# Patient Record
Sex: Male | Born: 1950 | Race: White | Hispanic: No | Marital: Married | Smoking: Never smoker
Health system: Southern US, Community
[De-identification: ages and names within clinical notes are randomized; demographics above are authoritative.]

---

## 2014-03-22 ENCOUNTER — Encounter (HOSPITAL_COMMUNITY): Payer: Self-pay | Admitting: Emergency Medicine

## 2014-03-22 ENCOUNTER — Emergency Department (HOSPITAL_COMMUNITY): Payer: No Typology Code available for payment source

## 2014-03-22 ENCOUNTER — Emergency Department (HOSPITAL_COMMUNITY)
Admission: EM | Admit: 2014-03-22 | Discharge: 2014-03-22 | Disposition: A | Discharge status: Home / Self Care | Payer: No Typology Code available for payment source | Attending: Emergency Medicine | Admitting: Emergency Medicine

## 2014-03-22 DIAGNOSIS — R109 Unspecified abdominal pain: Secondary | ICD-10-CM | POA: Insufficient documentation

## 2014-03-22 DIAGNOSIS — N200 Calculus of kidney: Secondary | ICD-10-CM | POA: Insufficient documentation

## 2014-03-22 DIAGNOSIS — Z7982 Long term (current) use of aspirin: Secondary | ICD-10-CM | POA: Insufficient documentation

## 2014-03-22 DIAGNOSIS — Z79899 Other long term (current) drug therapy: Secondary | ICD-10-CM | POA: Diagnosis not present

## 2014-03-22 LAB — URINALYSIS, ROUTINE W REFLEX MICROSCOPIC
BILIRUBIN URINE: NEGATIVE
Glucose, UA: NEGATIVE mg/dL
KETONES UR: 15 mg/dL — AB
Leukocytes, UA: NEGATIVE
Nitrite: NEGATIVE
Protein, ur: NEGATIVE mg/dL
Specific Gravity, Urine: 1.02 (ref 1.005–1.030)
Urobilinogen, UA: 0.2 mg/dL (ref 0.0–1.0)
pH: 5.5 (ref 5.0–8.0)

## 2014-03-22 LAB — URINE MICROSCOPIC-ADD ON

## 2014-03-22 MED ORDER — ONDANSETRON HCL 4 MG/2ML IJ SOLN
4.0000 mg | Freq: Once | INTRAMUSCULAR | Status: AC
  Administered 2014-03-22: 4 mg via INTRAVENOUS

## 2014-03-22 MED ORDER — KETOROLAC TROMETHAMINE 30 MG/ML IJ SOLN
15.0000 mg | Freq: Once | INTRAMUSCULAR | Status: AC
  Administered 2014-03-22: 15 mg via INTRAVENOUS
  Filled 2014-03-22: qty 1

## 2014-03-22 MED ORDER — TAMSULOSIN HCL 0.4 MG PO CAPS: 0.4000 mg | ORAL_CAPSULE | Freq: Every day | ORAL | Status: AC

## 2014-03-22 MED ORDER — PROMETHAZINE HCL 25 MG PO TABS: 25.0000 mg | ORAL_TABLET | Freq: Four times a day (QID) | ORAL | Status: AC | PRN

## 2014-03-22 MED ORDER — MORPHINE SULFATE 2 MG/ML IJ SOLN
INTRAMUSCULAR | Status: AC
  Filled 2014-03-22: qty 1

## 2014-03-22 MED ORDER — KETOROLAC TROMETHAMINE 30 MG/ML IJ SOLN
30.0000 mg | Freq: Once | INTRAMUSCULAR | Status: AC
  Administered 2014-03-22: 30 mg via INTRAVENOUS
  Filled 2014-03-22: qty 1

## 2014-03-22 MED ORDER — OXYCODONE-ACETAMINOPHEN 5-325 MG PO TABS: 2.0000 | ORAL_TABLET | ORAL | Status: AC | PRN

## 2014-03-22 MED ORDER — ONDANSETRON HCL 4 MG/2ML IJ SOLN
4.0000 mg | Freq: Once | INTRAMUSCULAR | Status: DC
  Filled 2014-03-22: qty 2

## 2014-03-22 MED ORDER — MORPHINE SULFATE 4 MG/ML IJ SOLN
4.0000 mg | Freq: Once | INTRAMUSCULAR | Status: AC
  Administered 2014-03-22: 4 mg via INTRAVENOUS
  Filled 2014-03-22: qty 1

## 2014-03-22 MED ORDER — ONDANSETRON HCL 4 MG/2ML IJ SOLN: 4.0000 mg | Freq: Once | INTRAMUSCULAR | Status: DC

## 2014-03-22 MED ORDER — MORPHINE SULFATE 2 MG/ML IJ SOLN
2.0000 mg | Freq: Once | INTRAMUSCULAR | Status: AC
  Administered 2014-03-22: 2 mg via INTRAVENOUS

## 2014-03-22 NOTE — Discharge Instructions (Signed)
CT scan shows a 3 mm kidney stone in your left ureter. Medication for pain, nausea, and to increase urinary flow. Followup with urologist if not improving. Phone number given.

## 2014-03-22 NOTE — ED Notes (Signed)
Patient with no complaints at this time. Respirations even and unlabored. Skin warm/dry. Discharge instructions reviewed with patient at this time. Patient given opportunity to voice concerns/ask questions. IV removed per policy and band-aid applied to site. Patient discharged at this time and left Emergency Department with steady gait.  

## 2014-03-22 NOTE — ED Notes (Signed)
Pt states left flank pain which began at 0400. Had same pain for a while yesterday also. Vomited this morning. No blood in urine at this point, per pt. Pt states he has been drinking lots of water since yesterday. Also states he has been moving for a couple of weeks but this does not feel like muscle pain, per pt.

## 2014-03-22 NOTE — ED Notes (Signed)
Phone xray regarding lab. Lab results are showing on their side but not in the ED chart. Results printed and brought over to EDP.

## 2014-03-22 NOTE — ED Provider Notes (Signed)
CSN: [id number]     Arrival date & time 03/22/14  0736 History   First MD Initiated Contact with Patient 03/22/14 0750    This chart was scribed for Alex Hutching, MD by Freida Busman, ED Scribe. This patient was seen in room [facility] and the patient's care was started 8:04 AM.  Chief Complaint  Patient presents with  . Flank Pain     The history is provided by the patient.   HPI Comments:  Alex Schneider is a 63 y.o. male who presents to the Emergency Department complaining of intermittent left flank pain that started about 2 days ago and  has been constant for about 4 hrs today. Pt reports associated nausea and vomiting and denies hematuria. Pt also reports h/o right sided kidney stone about 4-6 months ago, notes he was not evaluated for his previous kidney stone. No alleviating factors noted.     History reviewed. No pertinent past medical history. History reviewed. No pertinent past surgical history. No family history on file. History  Substance Use Topics  . Smoking status: Never Smoker   . Smokeless tobacco: Not on file  . Alcohol Use: Yes     Comment: Occ wine    Review of Systems  All other systems reviewed and are negative.   10 systems reviewed and negative other than pertinent ROS in HPI    Allergies  Review of patient's allergies indicates no known allergies.  Home Medications   Prior to Admission medications   Medication Sig Start Date End Date Taking? Authorizing Provider  Ascorbic Acid (VITAMIN C PO) Take 1 tablet by mouth daily.   Yes Historical Provider, MD  aspirin 325 MG tablet Take 650 mg by mouth daily as needed for moderate pain.   Yes Historical Provider, MD  Ibuprofen-Diphenhydramine Cit (MOTRIN PM PO) Take 1 tablet by mouth at bedtime as needed (pain).   Yes Historical Provider, MD  oxyCODONE-acetaminophen (PERCOCET) 5-325 MG per tablet Take 2 tablets by mouth every 4 (four) hours as needed. 03/22/14   Alex Hutching, MD  promethazine (PHENERGAN) 25  MG tablet Take 1 tablet (25 mg total) by mouth every 6 (six) hours as needed. 03/22/14   Alex Hutching, MD  tamsulosin (FLOMAX) 0.4 MG CAPS capsule Take 1 capsule (0.4 mg total) by mouth daily. 03/22/14   Alex Hutching, MD   BP 138/72  Pulse 64  Temp(Src) 97.7 F (36.5 C) (Oral)  Resp 18  Ht 6' (1.829 m)  Wt 195 lb (88.451 kg)  BMI 26.44 kg/m2  SpO2 97% Physical Exam  Nursing note and vitals reviewed. Constitutional: He is oriented to person, place, and time. He appears well-developed and well-nourished.  HENT:  Head: Normocephalic and atraumatic.  Eyes: Conjunctivae and EOM are normal. Pupils are equal, round, and reactive to light.  Neck: Normal range of motion. Neck supple.  Cardiovascular: Normal rate, regular rhythm and normal heart sounds.   Pulmonary/Chest: Effort normal and breath sounds normal.  Abdominal: Soft. Bowel sounds are normal.  Genitourinary:  Left flank tenderness  Musculoskeletal: Normal range of motion.  Neurological: He is alert and oriented to person, place, and time.  Skin: Skin is warm and dry.  Psychiatric: He has a normal mood and affect. His behavior is normal.    ED Course  Procedures (including critical care time)   DIAGNOSTIC STUDIES:  Oxygen Saturation is 99% on room air, normal by my interpretation.    COORDINATION OF CARE:  8:07 AM Discussed possibility of kidney stone and  plan to order CT and control pain with pt at bedside and pt agreed to plan.   Labs Review Labs Reviewed  URINALYSIS, ROUTINE W REFLEX MICROSCOPIC - Abnormal; Notable for the following:    Hgb urine dipstick LARGE (*)    Ketones, ur 15 (*)    All other components within normal limits  URINE MICROSCOPIC-ADD ON    Imaging Review No results found.   EKG Interpretation None      MDM   Final diagnoses:  Kidney stone on left side   CT scan reveals a left mid ureteral 3 mm stone. Pain is well-controlled. Discharge medications Percocet, Phenergan 25 mg, Flomax 0.4  mg.     Referral to urology   I personally performed the services described in this documentation, which was scribed in my presence. The recorded information has been reviewed and is accurate.      Alex Hutching, MD 03/22/14 1159

## 2015-04-18 IMAGING — CT CT ABD-PELV W/O CM
2 of 4 series · 16 of 46 positions shown, 18 images · non-contrast
Comparison: None.

CLINICAL DATA: Left flank pain.  Rule out kidney stone.

EXAM:
CT ABDOMEN AND PELVIS WITHOUT CONTRAST
TECHNIQUE: Multidetector CT imaging of the abdomen and pelvis was performed
following the standard protocol without IV contrast.

[Series 2: standard/full over (age)lbs 5.0 · axial · 0.85mm/px · z∈[-495,-25]mm · 13 of 104 slices shown, 15 images]
[im 5/104  soft-tissue]
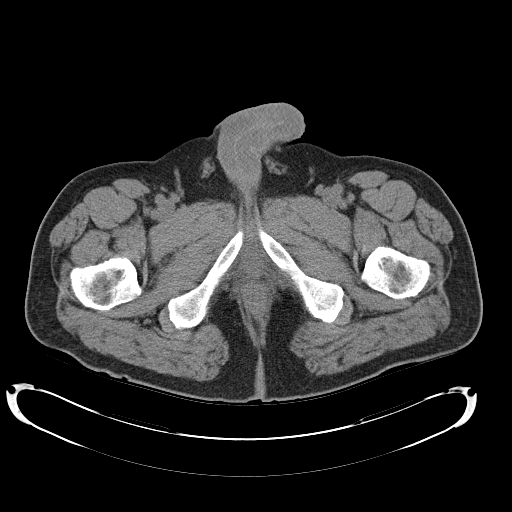
[im 5/104  bone]
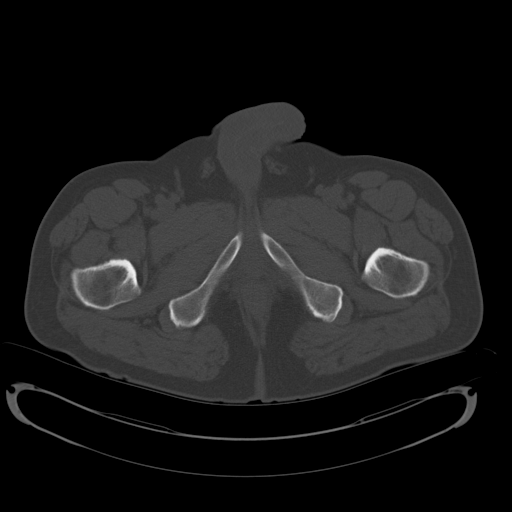
[im 13/104  soft-tissue]
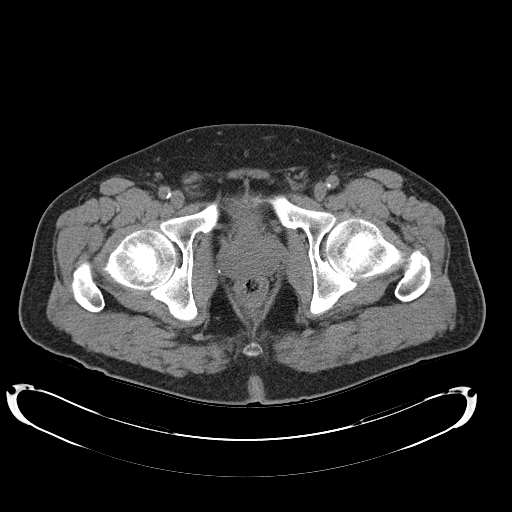
[im 22/104  soft-tissue]
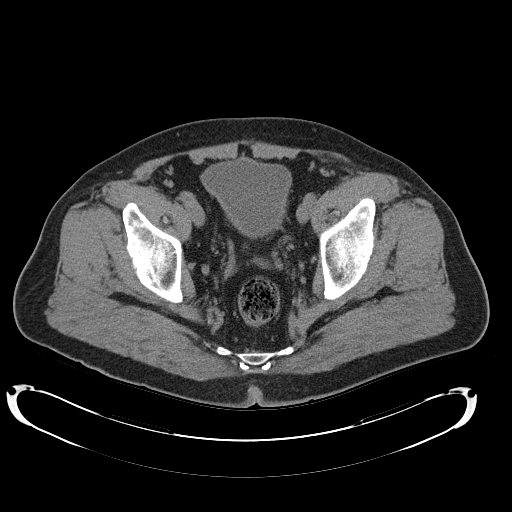
[im 31/104  soft-tissue]
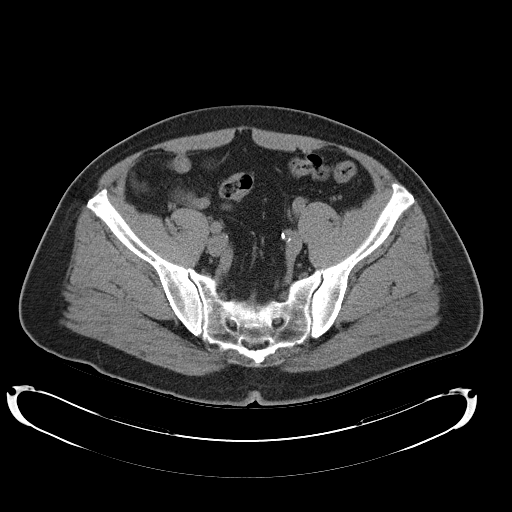
[im 35/104  soft-tissue]
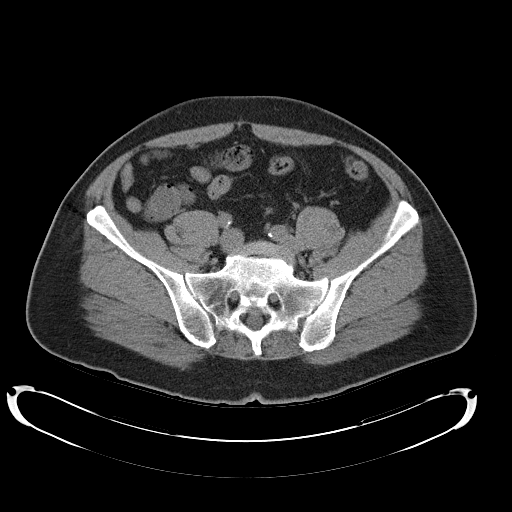
[im 43/104  soft-tissue]
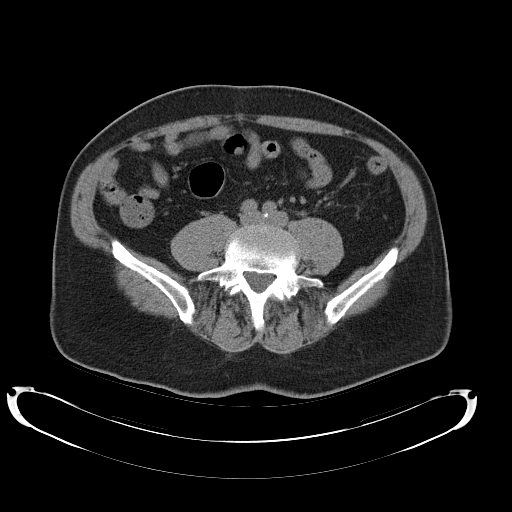
[im 52/104  soft-tissue]
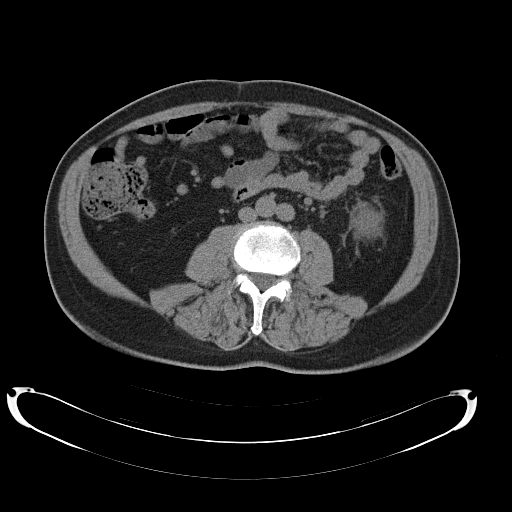
[im 61/104  soft-tissue]
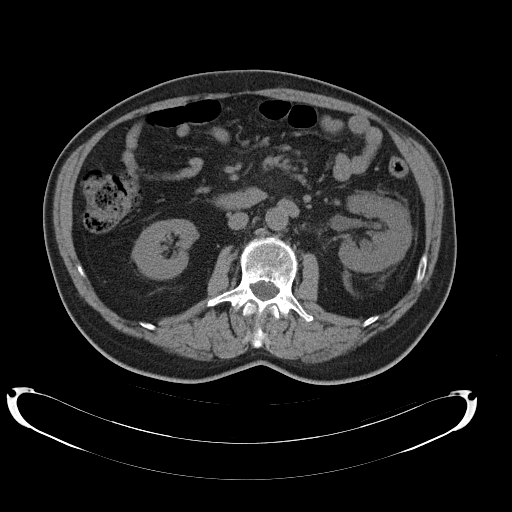
[im 69/104  soft-tissue]
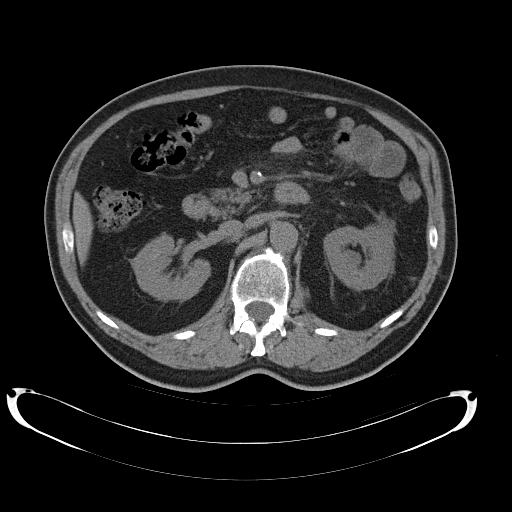
[im 69/104  bone]
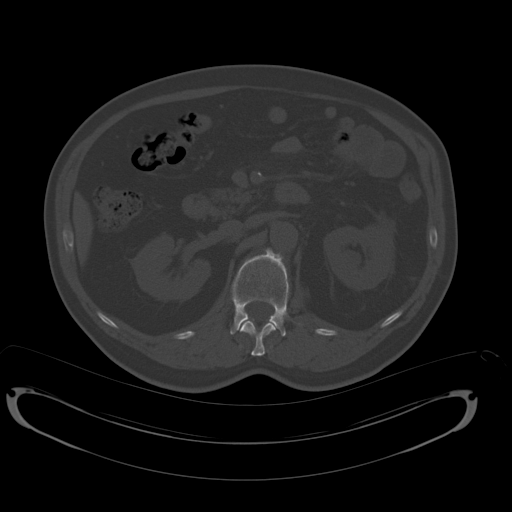
[im 73/104  soft-tissue]
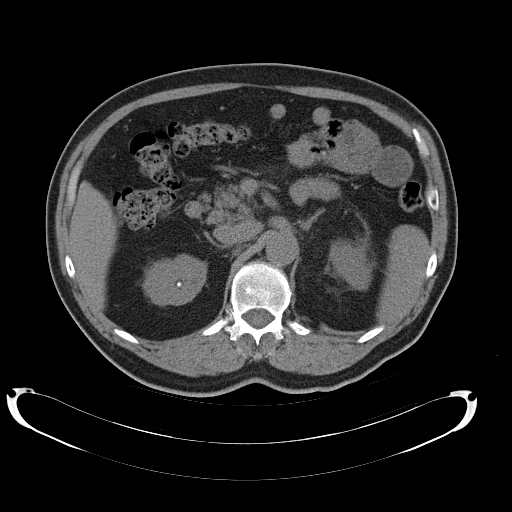
[im 82/104  soft-tissue]
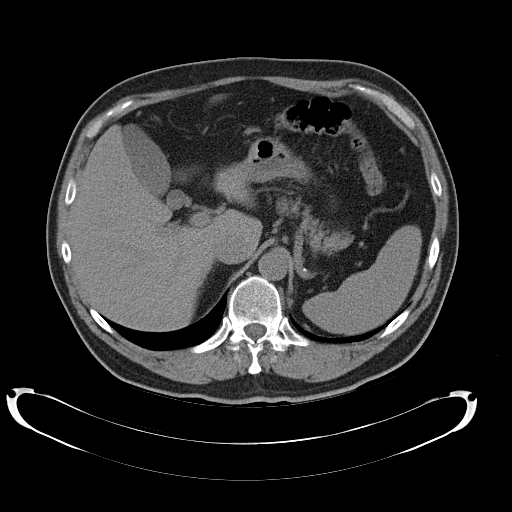
[im 91/104  soft-tissue]
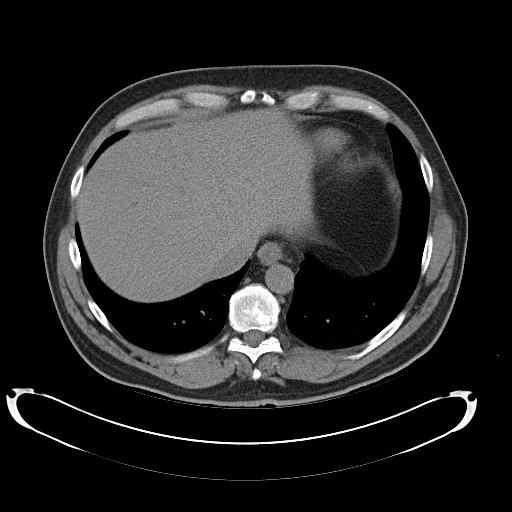
[im 99/104  soft-tissue]
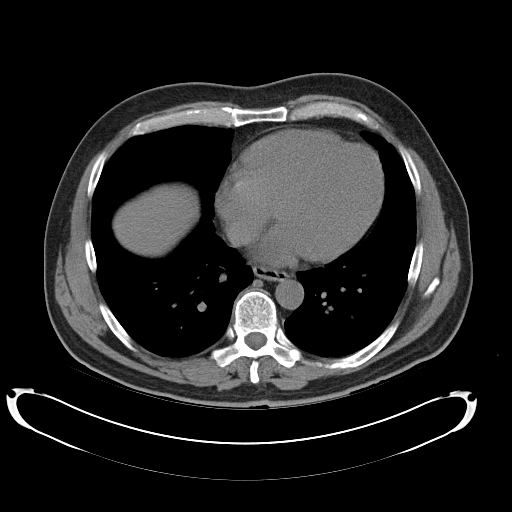

[Series 3: mpr coronal · coronal · 0.82mm/px · 3 of 102 slices shown]
[im 34/102  soft-tissue]
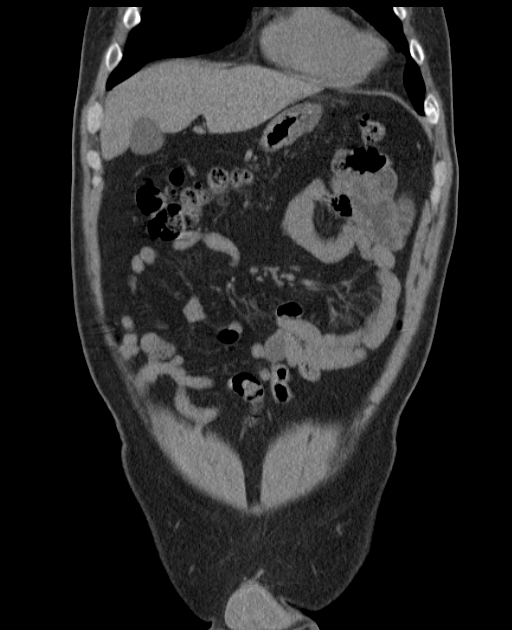
[im 45/102  soft-tissue]
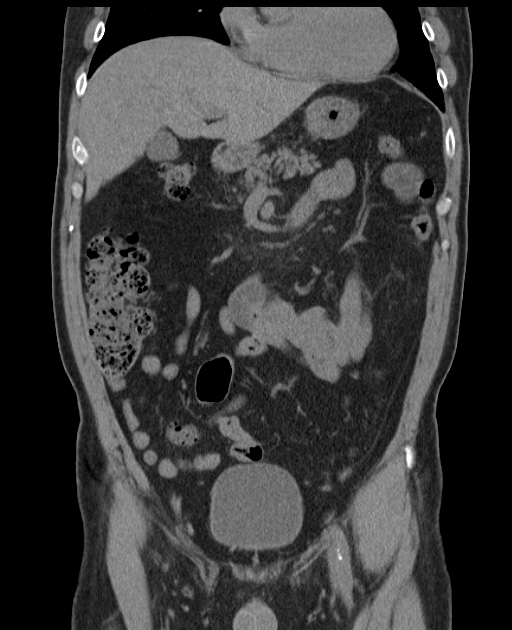
[im 57/102  soft-tissue]
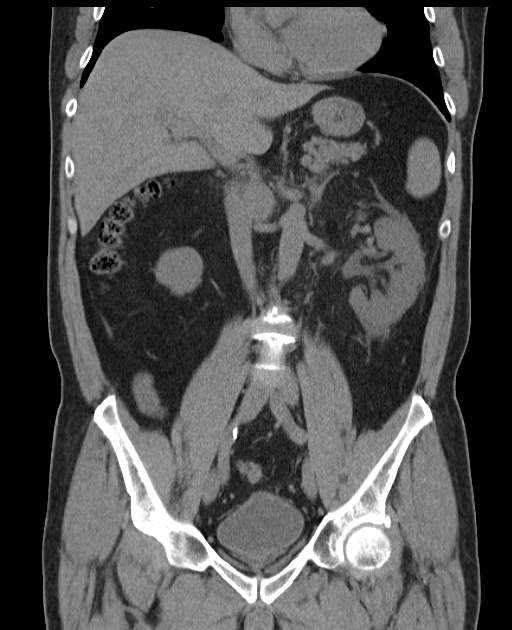

[16 of 46 positions shown; findings below may reference images not displayed]

FINDINGS: The lung bases are clear.  Heart size normal.

There is a 3 mm stone in the mid left ureter at the level of the
L3-L4 disc space that results in mild proximal
hydroureteronephrosis. There is perinephric stranding on the left.

There is a punctate stone in the lower pole of the left kidney.

There are 2 stones in the right kidney, including a 3 mm stone in
the superior pole of the right kidney and a 6 mm stone in the lower
pole of the right kidney.

Negative for hydronephrosis on the right. The right ureter is normal
in caliber. Negative for right ureteral stones.

Urinary bladder appears within normal limits.

Noncontrast appearance the liver, gallbladder, spleen, adrenal
glands, and pancreas is normal.

There is slight haziness in the central small bowel mesentery, a
nonspecific finding. No mesenteric lymphadenopathy. Negative for
retroperitoneal lymphadenopathy. Negative for ascites or free air.

Bowel loops are normal in caliber. No bowel wall thickening.
Appendix is normal.

Normal caliber abdominal aorta with small focal areas of
atherosclerotic calcification.

Prostatomegaly noted.

No acute or suspicious bony abnormality. Degenerative disc disease
L5-S1 noted.
IMPRESSION: 1. 3 mm mid left ureteral stone results in proximal mild
hydroureteronephrosis.
2. Bilateral nephrolithiasis.
3. Prostamegaly.
